# Patient Record
Sex: Male | Born: 2000 | Race: White | Hispanic: No | Marital: Single | Smoking: Never smoker
Health system: Southern US, Community
[De-identification: ages and names within clinical notes are randomized; demographics above are authoritative.]

## PROBLEM LIST (undated history)

## (undated) HISTORY — PX: NO PAST SURGERIES: SHX2092

---
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
## 2018-01-17 ENCOUNTER — Ambulatory Visit: Payer: 59 | Admitting: Internal Medicine

## 2018-01-17 ENCOUNTER — Encounter: Payer: Self-pay | Admitting: Internal Medicine

## 2018-01-17 VITALS — BP 116/68 | HR 65 | Temp 97.9°F | Wt 147.0 lb

## 2018-01-17 DIAGNOSIS — R1084 Generalized abdominal pain: Secondary | ICD-10-CM | POA: Diagnosis not present

## 2018-01-17 DIAGNOSIS — R51 Headache: Secondary | ICD-10-CM | POA: Diagnosis not present

## 2018-01-17 DIAGNOSIS — J029 Acute pharyngitis, unspecified: Secondary | ICD-10-CM

## 2018-01-17 DIAGNOSIS — J020 Streptococcal pharyngitis: Secondary | ICD-10-CM | POA: Diagnosis not present

## 2018-01-17 DIAGNOSIS — R519 Headache, unspecified: Secondary | ICD-10-CM

## 2018-01-17 LAB — POCT RAPID STREP A (OFFICE): RAPID STREP A SCREEN: POSITIVE — AB

## 2018-01-17 MED ORDER — AMOXICILLIN 500 MG PO CAPS: 500.0000 mg | ORAL_CAPSULE | Freq: Three times a day (TID) | ORAL | 0 refills | Status: DC

## 2018-01-17 NOTE — Patient Instructions (Signed)

## 2018-01-17 NOTE — Progress Notes (Signed)
Subjective:    Patient ID: Raymond Porter, male    DOB: 06-21-2001, 17 y.o.   MRN: [id number]  HPI  Pt presents to the clinic today with c/o headache, sore throat and abdominal pain. He reports this started 1-2 days ago. The headache is located in the temples. He describes the pain as achy. He denies visual changes or dizziness. He is having some difficulty swallowing. He denies runny nose, nasal congestion, ear pain or cough. The abdominal pain he describes as squeezing. It is intermittent, worse in the epigastric region. He denies associated nausea, vomiting, diarrhea or constipation. The abdominal pain is not worse or better after eating. He denies fever, chills or body aches. He denies urinary complaints. He has tried Dayquil, Nyquil and Tylenol without any relief. He has had sick contacts.  Review of Systems      No past medical history on file.  No current outpatient medications on file.   No current facility-administered medications for this visit.     No Known Allergies  Family History  Problem Relation Age of Onset  . Lung cancer Maternal Grandmother   . Stroke Paternal Grandfather     Social History   Socioeconomic History  . Marital status: Single    Spouse name: Not on file  . Number of children: Not on file  . Years of education: Not on file  . Highest education level: Not on file  Occupational History  . Not on file  Social Needs  . Financial resource strain: Not on file  . Food insecurity:    Worry: Not on file    Inability: Not on file  . Transportation needs:    Medical: Not on file    Non-medical: Not on file  Tobacco Use  . Smoking status: Never Smoker  . Smokeless tobacco: Never Used  Substance and Sexual Activity  . Alcohol use: No  . Drug use: No  . Sexual activity: Never  Lifestyle  . Physical activity:    Days per week: Not on file    Minutes per session: Not on file  . Stress: Not on file  Relationships  . Social connections:   Talks on phone: Not on file    Gets together: Not on file    Attends religious service: Not on file    Active member of club or organization: Not on file    Attends meetings of clubs or organizations: Not on file    Relationship status: Not on file  . Intimate partner violence:    Fear of current or ex partner: Not on file    Emotionally abused: Not on file    Physically abused: Not on file    Forced sexual activity: Not on file  Other Topics Concern  . Not on file  Social History Narrative  . Not on file     Constitutional: Pt reports headache. Denies fever, malaise, fatigue, or abrupt weight changes.  HEENT: Pt reports sore throat. Denies eye pain, eye redness, ear pain, ringing in the ears, wax buildup, runny nose, nasal congestion, bloody nose. Respiratory: Denies difficulty breathing, shortness of breath, cough or sputum production.   Cardiovascular: Denies chest pain, chest tightness, palpitations or swelling in the hands or feet.  Gastrointestinal: Pt reports abdominal pain. Denies bloating, constipation, diarrhea or blood in the stool.  GU: Denies urgency, frequency, pain with urination, burning sensation, blood in urine, odor or discharge.   No other specific complaints in a complete review of systems (  except as listed in HPI above).  Objective:   Physical Exam  BP 116/68   Pulse 65   Temp 97.9 F (36.6 C) (Oral)   Wt 147 lb (66.7 kg)   SpO2 98%   BMI 21.55 kg/m  Wt Readings from Last 3 Encounters:  01/17/18 147 lb (66.7 kg) (43 %, Z= -0.18)*  05/12/17 147 lb 8 oz (66.9 kg) (49 %, Z= -0.03)*   * Growth percentiles are based on CDC (Boys, 2-20 Years) data.    General: Appears his stated age, well developed, well nourished in NAD. HEENT: Head: normal shape and size, no sinus tenderness noted; Eyes: sclera white, no icterus, conjunctiva pink; Ears: Tm's gray and intact, normal light reflex;  Throat/Mouth: Teeth present, mucosa erythematous and moist, no exudate,  lesions or ulcerations noted.  Neck:  Bilateral anterior cervical adenopathy noted. Cardiovascular: Normal rate and rhythm. S1,S2 noted.  No murmur, rubs or gallops noted.  Pulmonary/Chest: Normal effort and positive vesicular breath sounds. No respiratory distess. No wheezes, rales or ronchi noted.  Abdomen: Soft . Mildly tender in the epigastric, RLQ and LLQ. Negative rebound tenderness. Negative psoas.  Hypoactivebowel sounds. No distention or masses noted. Liver, spleen and kidneys non palpable.      Assessment & Plan:   Headache, Sore Throat, Abdominal Pain:  RST: positive eRx for Amoxil 500 mg TID x 10 days Ibuprofen and salt water gargles as needed for pain and inflammation  Return precautions discussed Nicki Reaper, NP

## 2018-12-29 ENCOUNTER — Encounter: Payer: Self-pay | Admitting: Internal Medicine

## 2018-12-29 ENCOUNTER — Other Ambulatory Visit: Payer: Self-pay

## 2018-12-29 ENCOUNTER — Ambulatory Visit (INDEPENDENT_AMBULATORY_CARE_PROVIDER_SITE_OTHER): Payer: 59 | Admitting: Internal Medicine

## 2018-12-29 VITALS — BP 116/70 | HR 63 | Temp 97.9°F | Wt 150.0 lb

## 2018-12-29 DIAGNOSIS — J301 Allergic rhinitis due to pollen: Secondary | ICD-10-CM

## 2018-12-29 NOTE — Patient Instructions (Signed)

## 2018-12-29 NOTE — Progress Notes (Signed)
HPI  Pt presents to the clinic today with c/o headache, runny nose and sore throat. This started 2-3 days ago. The headache is located on the sides of his head. The pain is intermittent. He describes the pain as pressure. He denies visual changes or dizziness. He is blowing yellow mucous out of his nose. He denies difficulty swallowing. He denies fever or chills but has had body aches, but that has improved. He has tried Aleve, Dayquil and Nyquil with some relief. He has a history of allergies but reports this feels different. He has not had sick contacts. He does not smoke. He did not get his flu shot.   Review of Systems     No past medical history on file.  Family History  Problem Relation Age of Onset  . Lung cancer Maternal Grandmother   . Stroke Paternal Grandfather     Social History   Socioeconomic History  . Marital status: Single    Spouse name: Not on file  . Number of children: Not on file  . Years of education: Not on file  . Highest education level: Not on file  Occupational History  . Not on file  Social Needs  . Financial resource strain: Not on file  . Food insecurity:    Worry: Not on file    Inability: Not on file  . Transportation needs:    Medical: Not on file    Non-medical: Not on file  Tobacco Use  . Smoking status: Never Smoker  . Smokeless tobacco: Never Used  Substance and Sexual Activity  . Alcohol use: No  . Drug use: No  . Sexual activity: Never  Lifestyle  . Physical activity:    Days per week: Not on file    Minutes per session: Not on file  . Stress: Not on file  Relationships  . Social connections:    Talks on phone: Not on file    Gets together: Not on file    Attends religious service: Not on file    Active member of club or organization: Not on file    Attends meetings of clubs or organizations: Not on file    Relationship status: Not on file  . Intimate partner violence:    Fear of current or ex partner: Not on file   Emotionally abused: Not on file    Physically abused: Not on file    Forced sexual activity: Not on file  Other Topics Concern  . Not on file  Social History Narrative  . Not on file    No Known Allergies   Constitutional: Positive headache, body aches. Denies fever, fatigue or abrupt weight changes.  HEENT:  Positive runny nose, sore throat. Denies eye redness, eye pain, pressure behind the eyes, facial pain, nasal congestion, ear pain, ringing in the ears, wax buildup, or bloody nose. Respiratory:  Denies cough, difficulty breathing or shortness of breath.  Cardiovascular: Denies chest pain, chest tightness, palpitations or swelling in the hands or feet.   No other specific complaints in a complete review of systems (except as listed in HPI above).  Objective:   BP 116/70   Pulse 63   Temp 97.9 F (36.6 C) (Oral)   Wt 150 lb (68 kg)   SpO2 98%   BMI 21.99 kg/m   Wt Readings from Last 3 Encounters:  01/17/18 147 lb (66.7 kg) (43 %, Z= -0.18)*  05/12/17 147 lb 8 oz (66.9 kg) (49 %, Z= -0.03)*   *  Growth percentiles are based on CDC (Boys, 2-20 Years) data.     General: Appears his stated age, well developed, in NAD. HEENT: Head: normal shape and size, no sinus tenderness noted; Ears: Tm's gray and intact, normal light reflex; Nose: mucosa boggy and moist, turbinates swollen; Throat/Mouth: + PND. Teeth present, mucosa pink and moist, no exudate noted, no lesions or ulcerations noted.  Neck: No cervical lymphadenopathy.  Cardiovascular: Normal rate and rhythm. S1,S2 noted.  No murmur, rubs or gallops noted.  Pulmonary/Chest: Normal effort and positive vesicular breath sounds. No respiratory distress. No wheezes, rales or ronchi noted.       Assessment & Plan:   Allergic Rhinitis:  Get some rest and drink plenty of water Do salt water gargles for the sore throat Start Zyrtec and Flonase OTC No indication for flu or COVID 19 testing  RTC as needed or if symptoms  persist.   Nicki Reaper, NP

## 2019-08-27 ENCOUNTER — Other Ambulatory Visit: Payer: Self-pay

## 2019-08-27 DIAGNOSIS — Z20822 Contact with and (suspected) exposure to covid-19: Secondary | ICD-10-CM

## 2019-08-28 LAB — NOVEL CORONAVIRUS, NAA: SARS-CoV-2, NAA: NOT DETECTED

## 2019-08-29 ENCOUNTER — Telehealth: Payer: Self-pay | Admitting: Internal Medicine

## 2019-08-29 NOTE — Telephone Encounter (Signed)
Negative COVID results given. Patient results "NOT Detected." Caller expressed understanding. ° °

## 2019-09-24 ENCOUNTER — Other Ambulatory Visit: Payer: Self-pay

## 2019-09-24 DIAGNOSIS — Z20822 Contact with and (suspected) exposure to covid-19: Secondary | ICD-10-CM

## 2019-09-26 ENCOUNTER — Encounter: Payer: Self-pay | Admitting: *Deleted

## 2019-09-26 LAB — NOVEL CORONAVIRUS, NAA: SARS-CoV-2, NAA: DETECTED — AB

## 2019-10-15 ENCOUNTER — Ambulatory Visit (INDEPENDENT_AMBULATORY_CARE_PROVIDER_SITE_OTHER): Payer: 59 | Admitting: Internal Medicine

## 2019-10-15 ENCOUNTER — Other Ambulatory Visit: Payer: Self-pay

## 2019-10-15 ENCOUNTER — Encounter: Payer: Self-pay | Admitting: Internal Medicine

## 2019-10-15 VITALS — Temp 97.0°F

## 2019-10-15 DIAGNOSIS — J029 Acute pharyngitis, unspecified: Secondary | ICD-10-CM

## 2019-10-15 LAB — POCT RAPID STREP A (OFFICE): Rapid Strep A Screen: NEGATIVE

## 2019-10-15 MED ORDER — AMOXICILLIN 500 MG PO CAPS: 500.0000 mg | ORAL_CAPSULE | Freq: Three times a day (TID) | ORAL | 0 refills | Status: DC

## 2019-10-15 NOTE — Patient Instructions (Signed)

## 2019-10-15 NOTE — Progress Notes (Signed)
Virtual Visit via Video Note  I connected with Raymond Porter on 10/15/19 at  9:15 AM EST by a video enabled telemedicine application and verified that I am speaking with the correct person using two identifiers.  Location: Patient: Home Provider: Office   I discussed the limitations of evaluation and management by telemedicine and the availability of in person appointments. The patient expressed understanding and agreed to proceed.  History of Present Illness:  Pt reports sore throat. This started yesterday but seems worse today. He is having difficulty swallowing. He has noticed some white spots on the back of his throat. He denies headache, runny nose, nasal congestion, ear pain, loss of taste or smell, cough or SOB. He denies fever, chills or body aches. No one around him has similar symptoms. He has tried Dayquil/Nyquil with minimal relief. He tested positive for COVID 1 month ago, only symptom he had was loss of taste/smell.   No past medical history on file.  No current outpatient medications on file.   No current facility-administered medications for this visit.    No Known Allergies  Family History  Problem Relation Age of Onset  . Lung cancer Maternal Grandmother   . Stroke Paternal Grandfather     Social History   Socioeconomic History  . Marital status: Single    Spouse name: Not on file  . Number of children: Not on file  . Years of education: Not on file  . Highest education level: Not on file  Occupational History  . Not on file  Tobacco Use  . Smoking status: Never Smoker  . Smokeless tobacco: Never Used  Substance and Sexual Activity  . Alcohol use: No  . Drug use: No  . Sexual activity: Never  Other Topics Concern  . Not on file  Social History Narrative  . Not on file   Social Determinants of Health   Financial Resource Strain:   . Difficulty of Paying Living Expenses: Not on file  Food Insecurity:   . Worried About Charity fundraiser in the  Last Year: Not on file  . Ran Out of Food in the Last Year: Not on file  Transportation Needs:   . Lack of Transportation (Medical): Not on file  . Lack of Transportation (Non-Medical): Not on file  Physical Activity:   . Days of Exercise per Week: Not on file  . Minutes of Exercise per Session: Not on file  Stress:   . Feeling of Stress : Not on file  Social Connections:   . Frequency of Communication with Friends and Family: Not on file  . Frequency of Social Gatherings with Friends and Family: Not on file  . Attends Religious Services: Not on file  . Active Member of Clubs or Organizations: Not on file  . Attends Archivist Meetings: Not on file  . Marital Status: Not on file  Intimate Partner Violence:   . Fear of Current or Ex-Partner: Not on file  . Emotionally Abused: Not on file  . Physically Abused: Not on file  . Sexually Abused: Not on file     Constitutional: Denies fever, malaise, fatigue, headache or abrupt weight changes.  HEENT: Pt reports sore throat. Denies eye pain, eye redness, ear pain, ringing in the ears, wax buildup, runny nose, nasal congestion, bloody nose. Respiratory: Denies difficulty breathing, shortness of breath, cough or sputum production.   Cardiovascular: Denies chest pain, chest tightness, palpitations or swelling in the hands or feet.  Skin:  Denies redness, rashes, lesions or ulcercations.    No other specific complaints in a complete review of systems (except as listed in HPI above).  Observations/Objective:   General: Appears his stated age, well developed, well nourished in NAD. Skin: Warm, dry and intact. No rashes noted. HEENT: Head: normal shape and size; Throat/Mouth: Teeth present, mucosa erythematous and moist, white exudate, lesions or ulcerations noted.  Neck: Pulmonary/Chest: Normal effort. No respiratory distress.  Neurological: Alert and oriented.  Psychiatric: Mood and affect normal. Behavior is normal. Judgment  and thought content normal.    Assessment and Plan:  Sore Throat:  Had pt drive up outside lab area for RST: negative RX for Amoxil 500 mg TID x 10 days Encouraged Ibuprofen 400 mg every 8 hours as needed with food for pain and inflammation Salt water gargles for comfort  Return precautions discussed  Follow Up Instructions:    I discussed the assessment and treatment plan with the patient. The patient was provided an opportunity to ask questions and all were answered. The patient agreed with the plan and demonstrated an understanding of the instructions.   The patient was advised to call back or seek an in-person evaluation if the symptoms worsen or if the condition fails to improve as anticipated.    Nicki Reaper, NP

## 2021-07-24 ENCOUNTER — Encounter: Payer: 59 | Admitting: Nurse Practitioner

## 2021-12-22 ENCOUNTER — Encounter: Payer: Self-pay | Admitting: Internal Medicine

## 2021-12-22 ENCOUNTER — Ambulatory Visit (INDEPENDENT_AMBULATORY_CARE_PROVIDER_SITE_OTHER): Payer: 59 | Admitting: Internal Medicine

## 2021-12-22 ENCOUNTER — Other Ambulatory Visit: Payer: Self-pay

## 2021-12-22 VITALS — BP 132/83 | HR 110 | Temp 97.8°F | Ht 69.0 in | Wt 153.0 lb

## 2021-12-22 DIAGNOSIS — S20412A Abrasion of left back wall of thorax, initial encounter: Secondary | ICD-10-CM | POA: Diagnosis not present

## 2021-12-22 DIAGNOSIS — M25552 Pain in left hip: Secondary | ICD-10-CM

## 2021-12-22 DIAGNOSIS — M546 Pain in thoracic spine: Secondary | ICD-10-CM

## 2021-12-22 DIAGNOSIS — M545 Low back pain, unspecified: Secondary | ICD-10-CM
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
## 2021-12-22 NOTE — Patient Instructions (Signed)

## 2021-12-23 ENCOUNTER — Other Ambulatory Visit: Payer: Self-pay

## 2021-12-23 ENCOUNTER — Encounter: Payer: Self-pay | Admitting: Emergency Medicine

## 2021-12-23 ENCOUNTER — Emergency Department
Admission: EM | Admit: 2021-12-23 | Discharge: 2021-12-23 | Disposition: A | Discharge status: Home / Self Care | Payer: 59 | Attending: Emergency Medicine | Admitting: Emergency Medicine

## 2021-12-23 ENCOUNTER — Emergency Department: Payer: 59

## 2021-12-23 DIAGNOSIS — N200 Calculus of kidney: Secondary | ICD-10-CM | POA: Diagnosis not present

## 2021-12-23 DIAGNOSIS — N281 Cyst of kidney, acquired: Secondary | ICD-10-CM | POA: Diagnosis not present

## 2021-12-23 DIAGNOSIS — M545 Low back pain, unspecified: Secondary | ICD-10-CM | POA: Diagnosis present

## 2021-12-23 LAB — URINALYSIS, COMPLETE (UACMP) WITH MICROSCOPIC
Bacteria, UA: NONE SEEN
RBC / HPF: >50 RBC/hpf — ABNORMAL HIGH (ref 0–5)
Specific Gravity, Urine: 1.025 (ref 1.005–1.030)

## 2021-12-23 MED ORDER — KETOROLAC TROMETHAMINE 30 MG/ML IJ SOLN
15.0000 mg | Freq: Once | INTRAMUSCULAR | Status: AC
  Administered 2021-12-23: 15 mg via INTRAMUSCULAR
  Filled 2021-12-23: qty 1

## 2021-12-23 MED ORDER — KETOROLAC TROMETHAMINE 30 MG/ML IJ SOLN: 15.0000 mg | Freq: Once | INTRAMUSCULAR | Status: DC

## 2021-12-23 NOTE — Discharge Instructions (Addendum)
You have a 3 mm kidney stone on the left side which is likely causing your pain.  You can take Motrin 400 mg every 6 hours for the pain.   ? ?Can we also found a cyst on your kidney.  Please follow-up with your primary care provider regarding this as you should have an ultrasound and may need to have an additional CAT scan or MRI to follow the cyst. ?
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
## 2021-12-23 NOTE — ED Notes (Signed)
Patient transported to CT 
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
## 2022-01-19 ENCOUNTER — Encounter (HOSPITAL_COMMUNITY): Payer: Self-pay | Admitting: Radiology

## 2022-01-19 ENCOUNTER — Encounter: Payer: 59 | Admitting: Internal Medicine

## 2022-02-01 ENCOUNTER — Encounter: Payer: Self-pay | Admitting: Internal Medicine

## 2022-02-01 ENCOUNTER — Other Ambulatory Visit (HOSPITAL_COMMUNITY)
Admission: RE | Admit: 2022-02-01 | Discharge: 2022-02-01 | Disposition: A | Discharge status: Home / Self Care | Payer: 59 | Source: Ambulatory Visit | Attending: Internal Medicine | Admitting: Internal Medicine

## 2022-02-01 ENCOUNTER — Ambulatory Visit (INDEPENDENT_AMBULATORY_CARE_PROVIDER_SITE_OTHER): Payer: 59 | Admitting: Internal Medicine

## 2022-02-01 VITALS — BP 126/80 | HR 58 | Temp 96.9°F | Ht 69.0 in | Wt 153.0 lb

## 2022-02-01 DIAGNOSIS — Z113 Encounter for screening for infections with a predominantly sexual mode of transmission: Secondary | ICD-10-CM | POA: Diagnosis present

## 2022-02-01 DIAGNOSIS — Z23 Encounter for immunization: Secondary | ICD-10-CM | POA: Diagnosis not present

## 2022-02-01 DIAGNOSIS — Z114 Encounter for screening for human immunodeficiency virus [HIV]: Secondary | ICD-10-CM

## 2022-02-01 DIAGNOSIS — Z1159 Encounter for screening for other viral diseases: Secondary | ICD-10-CM

## 2022-02-01 DIAGNOSIS — Z0001 Encounter for general adult medical examination with abnormal findings: Secondary | ICD-10-CM

## 2022-02-01 NOTE — Patient Instructions (Signed)
Health Maintenance, Male Adopting a healthy lifestyle and getting preventive care are important in promoting health and wellness. Ask your health care provider about: The right schedule for you to have regular tests and exams. Things you can do on your own to prevent diseases and keep yourself healthy. What should I know about diet, weight, and exercise? Eat a healthy diet  Eat a diet that includes plenty of vegetables, fruits, low-fat dairy products, and lean protein. Do not eat a lot of foods that are high in solid fats, added sugars, or sodium. Maintain a healthy weight Body mass index (BMI) is a measurement that can be used to identify possible weight problems. It estimates body fat based on height and weight. Your health care provider can help determine your BMI and help you achieve or maintain a healthy weight. Get regular exercise Get regular exercise. This is one of the most important things you can do for your health. Most adults should: Exercise for at least 150 minutes each week. The exercise should increase your heart rate and make you sweat (moderate-intensity exercise). Do strengthening exercises at least twice a week. This is in addition to the moderate-intensity exercise. Spend less time sitting. Even light physical activity can be beneficial. Watch cholesterol and blood lipids Have your blood tested for lipids and cholesterol at 23 years of age, then have this test every 5 years. You may need to have your cholesterol levels checked more often if: Your lipid or cholesterol levels are high. You are older than 23 years of age. You are at high risk for heart disease. What should I know about cancer screening? Many types of cancers can be detected early and may often be prevented. Depending on your health history and family history, you may need to have cancer screening at various ages. This may include screening for: Colorectal cancer. Prostate cancer. Skin cancer. Lung  cancer. What should I know about heart disease, diabetes, and high blood pressure? Blood pressure and heart disease High blood pressure causes heart disease and increases the risk of stroke. This is more likely to develop in people who have high blood pressure readings or are overweight. Talk with your health care provider about your target blood pressure readings. Have your blood pressure checked: Every 3-5 years if you are 23-39 years of age. Every year if you are 40 years old or older. If you are between the ages of 65 and 75 and are a current or former smoker, ask your health care provider if you should have a one-time screening for abdominal aortic aneurysm (AAA). Diabetes Have regular diabetes screenings. This checks your fasting blood sugar level. Have the screening done: Once every three years after age 23 if you are at a normal weight and have a low risk for diabetes. More often and at a younger age if you are overweight or have a high risk for diabetes. What should I know about preventing infection? Hepatitis B If you have a higher risk for hepatitis B, you should be screened for this virus. Talk with your health care provider to find out if you are at risk for hepatitis B infection. Hepatitis C Blood testing is recommended for: Everyone born from 1945 through 1965. Anyone with known risk factors for hepatitis C. Sexually transmitted infections (STIs) You should be screened each year for STIs, including gonorrhea and chlamydia, if: You are sexually active and are younger than 24 years of age. You are older than 24 years of age and your   health care provider tells you that you are at risk for this type of infection. Your sexual activity has changed since you were last screened, and you are at increased risk for chlamydia or gonorrhea. Ask your health care provider if you are at risk. Ask your health care provider about whether you are at high risk for HIV. Your health care provider  may recommend a prescription medicine to help prevent HIV infection. If you choose to take medicine to prevent HIV, you should first get tested for HIV. You should then be tested every 3 months for as long as you are taking the medicine. Follow these instructions at home: Alcohol use Do not drink alcohol if your health care provider tells you not to drink. If you drink alcohol: Limit how much you have to 0-2 drinks a day. Know how much alcohol is in your drink. In the [place] one drink equals one 12 oz bottle of beer (355 mL), one 5 oz glass of wine (148 mL), or one 1 oz glass of hard liquor (44 mL). Lifestyle Do not use any products that contain nicotine or tobacco. These products include cigarettes, chewing tobacco, and vaping devices, such as e-cigarettes. If you need help quitting, ask your health care provider. Do not use street drugs. Do not share needles. Ask your health care provider for help if you need support or information about quitting drugs. General instructions Schedule regular health, dental, and eye exams. Stay current with your vaccines. Tell your health care provider if: You often feel depressed. You have ever been abused or do not feel safe at home. Summary Adopting a healthy lifestyle and getting preventive care are important in promoting health and wellness. Follow your health care provider's instructions about healthy diet, exercising, and getting tested or screened for diseases. Follow your health care provider's instructions on monitoring your cholesterol and blood pressure. This information is not intended to replace advice given to you by your health care provider. Make sure you discuss any questions you have with your health care provider. Document Revised: 02/23/2021 Document Reviewed: 02/23/2021 Elsevier Patient Education  2023 [employer]  

## 2022-02-01 NOTE — Progress Notes (Signed)
? ?Subjective:  ? ? Patient ID: Raymond Porter, male    DOB: [birth year], 21 y.o.   MRN: [id number] ? ?HPI ? ?Patient presents to clinic today for his annual exam. ? ?Flu: never ?Tetanus: 04/2011 ?COVID: Moderna x 2 ?Dentist: as needed ? ?Diet: He does eat meat. He consumes fruits and veggies. He does eat some fried foods. He drinks mostly water. ?Exercise: Body weight exercises, walk, disc golf, roller blading ? ?Review of Systems ? ?No past medical history on file. ? ?No current outpatient medications on file.  ? ?No current facility-administered medications for this visit.  ? ? ?No Known Allergies ? ?Family History  ?Problem Relation Age of Onset  ? Lung cancer Maternal Grandmother   ? Stroke Paternal Grandfather   ? ? ?Social History  ? ?Socioeconomic History  ? Marital status: Single  ?  Spouse name: Not on file  ? Number of children: Not on file  ? Years of education: Not on file  ? Highest education level: Not on file  ?Occupational History  ? Not on file  ?Tobacco Use  ? Smoking status: Never  ? Smokeless tobacco: Never  ?Vaping Use  ? Vaping Use: Never used  ?Substance and Sexual Activity  ? Alcohol use: No  ? Drug use: No  ? Sexual activity: Never  ?Other Topics Concern  ? Not on file  ?Social History Narrative  ? Not on file  ? ?Social Determinants of Health  ? ?Financial Resource Strain: Not on file  ?Food Insecurity: Not on file  ?Transportation Needs: Not on file  ?Physical Activity: Not on file  ?Stress: Not on file  ?Social Connections: Not on file  ?Intimate Partner Violence: Not on file  ? ? ? ?Constitutional: Denies fever, malaise, fatigue, headache or abrupt weight changes.  ?HEENT: Denies eye pain, eye redness, ear pain, ringing in the ears, wax buildup, runny nose, nasal congestion, bloody nose, or sore throat. ?Respiratory: Denies difficulty breathing, shortness of breath, cough or sputum production.   ?Cardiovascular: Denies chest pain, chest tightness, palpitations or swelling in the hands or  feet.  ?Gastrointestinal: Denies abdominal pain, bloating, constipation, diarrhea or blood in the stool.  ?GU: Denies urgency, frequency, pain with urination, burning sensation, blood in urine, odor or discharge. ?Musculoskeletal: Pt reports intermittent back pain. Denies decrease in range of motion, difficulty with gait, muscle pain or joint pain and swelling.  ?Skin: Denies redness, rashes, lesions or ulcercations.  ?Neurological: Denies dizziness, difficulty with memory, difficulty with speech or problems with balance and coordination.  ?Psych: Denies anxiety, depression, SI/HI. ? ?No other specific complaints in a complete review of systems (except as listed in HPI above). ? ?   ?Objective:  ? Physical Exam ?BP 126/80 (BP Location: Left Arm, Patient Position: Sitting, Cuff Size: Large)   Pulse (!) 58   Temp (!) 96.9 ?F (36.1 ?C) (Temporal)   Ht _0  (1.753 m)   Wt 153 lb (69.4 kg)   SpO2 99%   BMI 22.59 kg/m?  ?  ?Wt Readings from Last 3 Encounters:  ?12/23/21 155 lb (70.3 kg)  ?12/22/21 153 lb (69.4 kg)  ?12/29/18 150 lb (68 kg) (42 %, Z= -0.20)*  ? ?* Growth percentiles are based on CDC (Boys, 2-20 Years) data.  ? ? ?General: Appears his stated age, well developed, well nourished in NAD. ?Skin: Warm, dry and intact.  ?HEENT: Head: normal shape and size; Eyes: sclera white, no icterus, conjunctiva pink, PERRLA and EOMs intact;  ?Neck:  Neck supple, trachea midline. No masses, lumps or thyromegaly present.  ?Cardiovascular: Bradycardic with normal rhythm. S1,S2 noted.  No murmur, rubs or gallops noted. No JVD or BLE edema.  ?Pulmonary/Chest: Normal effort and positive vesicular breath sounds. No respiratory distress. No wheezes, rales or ronchi noted.  ?Abdomen: Soft and nontender. Normal bowel sounds. No distention or masses noted. Liver, spleen and kidneys non palpable. ?Musculoskeletal: No bony tenderness noted over the spine. Strength 5/5 BUE/BLE. No difficulty with gait.  ?Neurological: Alert and  oriented. Cranial nerves II-XII grossly intact. Coordination normal.  ?Psychiatric: Mood and affect normal. Behavior is normal. Judgment and thought content normal.  ? ? ?   ?Assessment & Plan:  ? ? ?Preventative Health Maintenance: ? ?Encouraged him to get a flu shot in the fall ?Tetanus today ?Encouraged him to get his COVID booster ?Encouraged him to consume a balanced diet and exercise regimen ?Advised him to see a and dentist annually ?We will check CBC, c-Met, lipid, HIV and hep C today ? ?Screen for STD: ? ?Will check HIV and Hep C today ?Will check urine gonorrhea, chlamydia and trich ? ?RTC in 1 year, sooner if needed ?Webb Silversmith, NP ? ?

## 2022-02-02 LAB — COMPLETE METABOLIC PANEL WITH GFR
AG Ratio: 2.3 (calc) (ref 1.0–2.5)
ALT: 19 U/L (ref 9–46)
AST: 22 U/L (ref 10–40)
Albumin: 4.5 g/dL (ref 3.6–5.1)
Alkaline phosphatase (APISO): 59 U/L (ref 36–130)
BUN: 18 mg/dL (ref 7–25)
CO2: 28 mmol/L (ref 20–32)
Calcium: 9.3 mg/dL (ref 8.6–10.3)
Chloride: 105 mmol/L (ref 98–110)
Creat: 1.11 mg/dL (ref 0.60–1.24)
Globulin: 2 g/dL (calc) (ref 1.9–3.7)
Glucose, Bld: 86 mg/dL (ref 65–99)
Potassium: 4.2 mmol/L (ref 3.5–5.3)
Sodium: 141 mmol/L (ref 135–146)
Total Bilirubin: 0.8 mg/dL (ref 0.2–1.2)
Total Protein: 6.5 g/dL (ref 6.1–8.1)
eGFR: 96 mL/min/{1.73_m2} (ref >=60)

## 2022-02-02 LAB — CBC
HCT: 47.6 % (ref 38.5–50.0)
Hemoglobin: 15.8 g/dL (ref 13.2–17.1)
MCH: 29.5 pg (ref 27.0–33.0)
MCHC: 33.2 g/dL (ref 32.0–36.0)
MCV: 88.8 fL (ref 80.0–100.0)
MPV: 12.1 fL (ref 7.5–12.5)
Platelets: 131 10*3/uL — ABNORMAL LOW (ref 140–400)
RBC: 5.36 10*6/uL (ref 4.20–5.80)
RDW: 12.3 % (ref 11.0–15.0)
WBC: 3.7 10*3/uL — ABNORMAL LOW (ref 3.8–10.8)

## 2022-02-02 LAB — URINE CYTOLOGY ANCILLARY ONLY
Chlamydia: NEGATIVE
Comment: NEGATIVE
Comment: NEGATIVE
Comment: NORMAL
Neisseria Gonorrhea: NEGATIVE
Trichomonas: NEGATIVE

## 2022-02-02 LAB — HEPATITIS C ANTIBODY
Hepatitis C Ab: NONREACTIVE
SIGNAL TO CUT-OFF: 0.05 (ref <1.00)

## 2022-02-02 LAB — LIPID PANEL
Cholesterol: 153 mg/dL (ref <200)
HDL: 59 mg/dL (ref >=40)
LDL Cholesterol (Calc): 83 mg/dL (calc)
Non-HDL Cholesterol (Calc): 94 mg/dL (calc) (ref <130)
Total CHOL/HDL Ratio: 2.6 (calc) (ref <5.0)
Triglycerides: 40 mg/dL (ref <150)

## 2022-02-02 LAB — HIV ANTIBODY (ROUTINE TESTING W REFLEX): HIV 1&2 Ab, 4th Generation: NONREACTIVE

## 2022-02-04 ENCOUNTER — Ambulatory Visit: Payer: Self-pay | Admitting: *Deleted

## 2022-02-04 NOTE — Telephone Encounter (Signed)
Patient called back to report in March was seen in ED for kidney stone and it was noted he has a renal cyst. Patient wanted to notify PCP if this could cause elevated white blood cell count. No additional questions from patient at this time.  ?

## 2022-02-04 NOTE — Telephone Encounter (Signed)
No it should not. Smoking can cause elevated WBC. Some people just have elevated WBC for no reason. ?

## 2022-02-04 NOTE — Telephone Encounter (Signed)
LMTCB 02/04/2022.  PEC please advise pt when he calls back.  ? ?Thanks,  ? ?-Vernona Rieger  ?

## 2022-03-12 ENCOUNTER — Other Ambulatory Visit: Payer: 59

## 2022-03-12 ENCOUNTER — Other Ambulatory Visit: Payer: Self-pay | Admitting: Internal Medicine

## 2022-03-12 DIAGNOSIS — D696 Thrombocytopenia, unspecified: Secondary | ICD-10-CM

## 2022-03-12 DIAGNOSIS — D709 Neutropenia, unspecified: Secondary | ICD-10-CM | POA: Insufficient documentation

## 2022-03-12 LAB — CBC WITH DIFFERENTIAL/PLATELET
Absolute Monocytes: 605 cells/uL (ref 200–950)
Basophils Absolute: 50 cells/uL (ref 0–200)
Basophils Relative: 0.6 %
Eosinophils Absolute: 42 cells/uL (ref 15–500)
Eosinophils Relative: 0.5 %
HCT: 47.2 % (ref 38.5–50.0)
Hemoglobin: 15.7 g/dL (ref 13.2–17.1)
Lymphs Abs: 2276 cells/uL (ref 850–3900)
MCH: 29.1 pg (ref 27.0–33.0)
MCHC: 33.3 g/dL (ref 32.0–36.0)
MCV: 87.4 fL (ref 80.0–100.0)
MPV: 11.7 fL (ref 7.5–12.5)
Monocytes Relative: 7.2 %
Neutro Abs: 5426 cells/uL (ref 1500–7800)
Neutrophils Relative %: 64.6 %
Platelets: 178 10*3/uL (ref 140–400)
RBC: 5.4 10*6/uL (ref 4.20–5.80)
RDW: 11.8 % (ref 11.0–15.0)
Total Lymphocyte: 27.1 %
WBC: 8.4 10*3/uL (ref 3.8–10.8)

## 2022-05-21 ENCOUNTER — Ambulatory Visit
Admission: RE | Admit: 2022-05-21 | Discharge: 2022-05-21 | Disposition: A | Discharge status: Home / Self Care | Payer: 59 | Source: Ambulatory Visit | Attending: Internal Medicine | Admitting: Internal Medicine

## 2022-05-21 ENCOUNTER — Ambulatory Visit (INDEPENDENT_AMBULATORY_CARE_PROVIDER_SITE_OTHER): Payer: 59 | Admitting: Internal Medicine

## 2022-05-21 ENCOUNTER — Encounter: Payer: Self-pay | Admitting: Internal Medicine

## 2022-05-21 VITALS — BP 124/72 | HR 98 | Temp 97.7°F | Wt 156.0 lb

## 2022-05-21 DIAGNOSIS — M25561 Pain in right knee: Secondary | ICD-10-CM | POA: Diagnosis present

## 2022-05-21 MED ORDER — PREDNISONE 10 MG PO TABS: ORAL_TABLET | ORAL | 0 refills | Status: AC

## 2022-05-21 NOTE — Progress Notes (Signed)
Subjective:    Patient ID: Raymond Porter, male    DOB: [birth year], 21 y.o.   MRN: [id number]  HPI  Patient presents to clinic today with complaint of right knee pain.  This started in the beginning of June after a run. He describes the pain as sore and achy. The pain is worse with driving, walking. He denies numbness, tingling or weakness of the right lower extremity. He did not notice any swelling. He is currently unable to run. He has tried rest, ice, elevation, compression and NSAID's OTC with minimal improvement in symptoms.   Review of Systems     No past medical history on file.  No current outpatient medications on file.   No current facility-administered medications for this visit.    No Known Allergies  Family History  Problem Relation Age of Onset   Lung cancer Maternal Grandmother    Stroke Paternal Grandfather    Colon cancer Neg Hx    Prostate cancer Neg Hx     Social History   Socioeconomic History   Marital status: Single    Spouse name: Not on file   Number of children: Not on file   Years of education: Not on file   Highest education level: Not on file  Occupational History   Not on file  Tobacco Use   Smoking status: Never   Smokeless tobacco: Never  Vaping Use   Vaping Use: Never used  Substance and Sexual Activity   Alcohol use: No   Drug use: No   Sexual activity: Never  Other Topics Concern   Not on file  Social History Narrative   Not on file   Social Determinants of Health   Financial Resource Strain: Not on file  Food Insecurity: Not on file  Transportation Needs: Not on file  Physical Activity: Not on file  Stress: Not on file  Social Connections: Not on file  Intimate Partner Violence: Not on file     Constitutional: Denies fever, malaise, fatigue, headache or abrupt weight changes.  Respiratory: Denies difficulty breathing, shortness of breath, cough or sputum production.   Cardiovascular: Denies chest pain, chest  tightness, palpitations or swelling in the hands or feet.  Musculoskeletal: Patient reports right knee pain.  Denies decrease in range of motion, difficulty with gait, muscle pain or joint swelling.  Neurological: Denies numbness, tingling, weakness, or problems with balance and coordination.  Psych: Denies anxiety, depression, SI/HI.  No other specific complaints in a complete review of systems (except as listed in HPI above).  Objective:   Physical Exam  BP 124/72 (BP Location: Left Arm, Patient Position: Sitting, Cuff Size: Normal)   Pulse 98   Temp 97.7 F (36.5 C) (Temporal)   Wt 156 lb (70.8 kg)   SpO2 99%   BMI 23.04 kg/m   Wt Readings from Last 3 Encounters:  02/01/22 153 lb (69.4 kg)  12/23/21 155 lb (70.3 kg)  12/22/21 153 lb (69.4 kg)    General: Appears his stated age, well developed, well nourished in NAD. Skin: Warm, dry and intact.  Cardiovascular: Normal rate and rhythm.  Pulmonary/Chest: Normal effort and positive vesicular breath sounds. No respiratory distress. No wheezes, rales or ronchi noted.  Musculoskeletal: Normal flexion and extension of the right knee.  Pain with palpation over the inferior patellar tendon.  Pain with palpation over bilateral joint lines.  No signs of joint swelling.  Strength 5/5 BLE.  No difficulty with gait.  Neurological: Alert and oriented.  BMET    Component Value Date/Time   NA 141 02/01/2022 0917   K 4.2 02/01/2022 0917   CL 105 02/01/2022 0917   CO2 28 02/01/2022 0917   GLUCOSE 86 02/01/2022 0917   BUN 18 02/01/2022 0917   CREATININE 1.11 02/01/2022 0917   CALCIUM 9.3 02/01/2022 0917    Lipid Panel     Component Value Date/Time   CHOL 153 02/01/2022 0917   TRIG 40 02/01/2022 0917   HDL 59 02/01/2022 0917   CHOLHDL 2.6 02/01/2022 0917   LDLCALC 83 02/01/2022 0917    CBC    Component Value Date/Time   WBC 8.4 03/12/2022 1325   RBC 5.40 03/12/2022 1325   HGB 15.7 03/12/2022 1325   HCT 47.2 03/12/2022 1325    PLT 178 03/12/2022 1325   MCV 87.4 03/12/2022 1325   MCH 29.1 03/12/2022 1325   MCHC 33.3 03/12/2022 1325   RDW 11.8 03/12/2022 1325   LYMPHSABS 2,276 03/12/2022 1325   EOSABS 42 03/12/2022 1325   BASOSABS 50 03/12/2022 1325    Hgb A1C No results found for: "HGBA1C"         Assessment & Plan:   Right Knee Pain:  Rx for Pred taper x9 days X-ray right knee ordered Consider PT versus MRI if symptoms persist or worsen  RTC in 8 months for your annual exam Nicki Reaper, NP

## 2022-05-21 NOTE — Patient Instructions (Signed)

## 2022-06-02 ENCOUNTER — Encounter: Payer: Self-pay | Admitting: Internal Medicine

## 2023-06-26 IMAGING — CT CT RENAL STONE PROTOCOL
2 of 4 series · 15 of 46 positions shown, 17 images · non-contrast
Comparison: None.

CLINICAL DATA: Flank pain with kidney stone suspected



[Series 2: ap without · axial · non-contrast · 0.66mm/px · z∈[-1133,-658]mm · 12 of 106 slices shown, 14 images]
[im 6/106  soft-tissue]
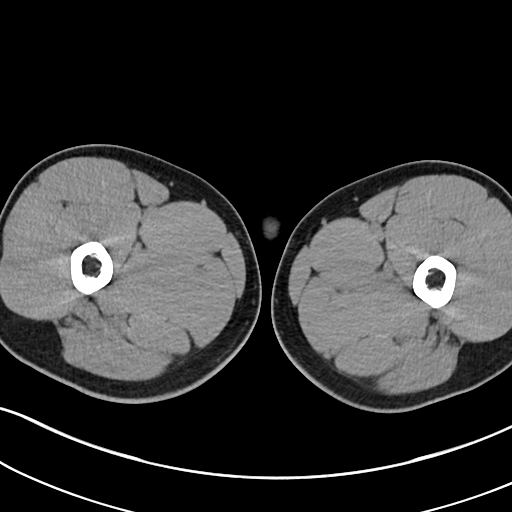
[im 6/106  bone]
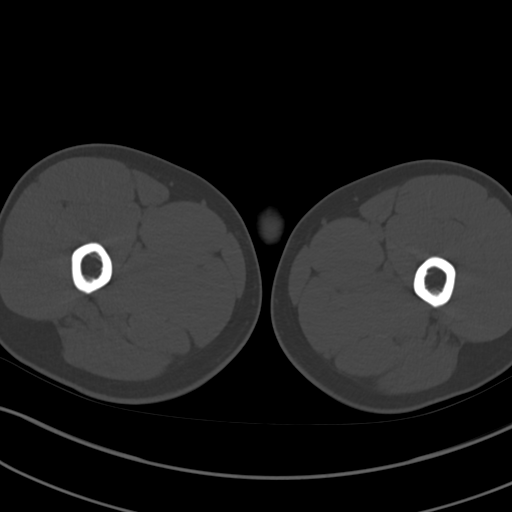
[im 16/106  soft-tissue]
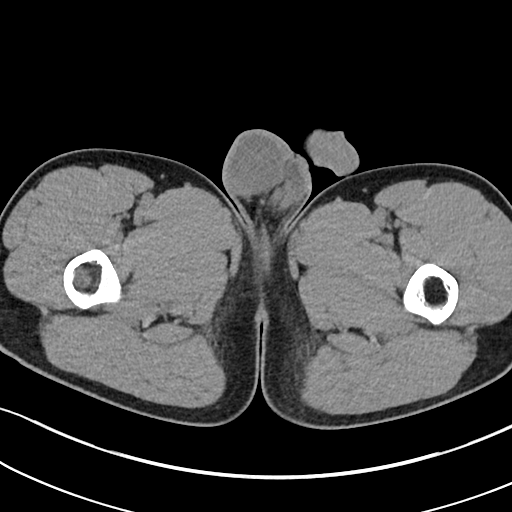
[im 26/106  soft-tissue]
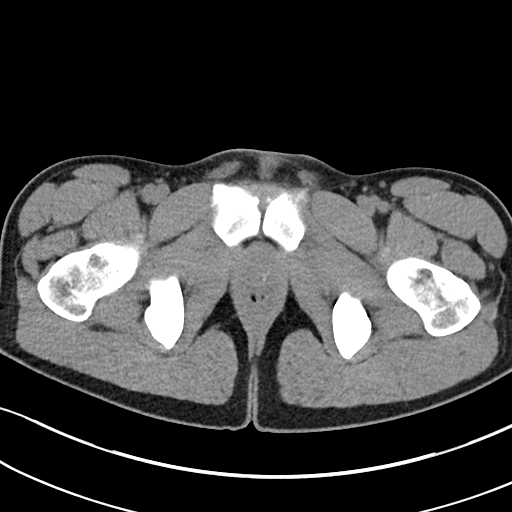
[im 31/106  soft-tissue]
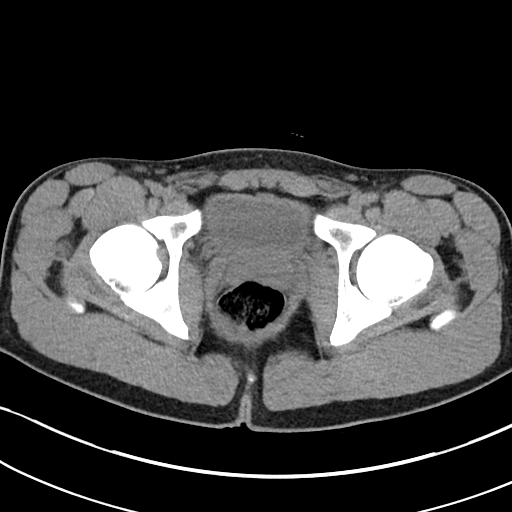
[im 41/106  soft-tissue]
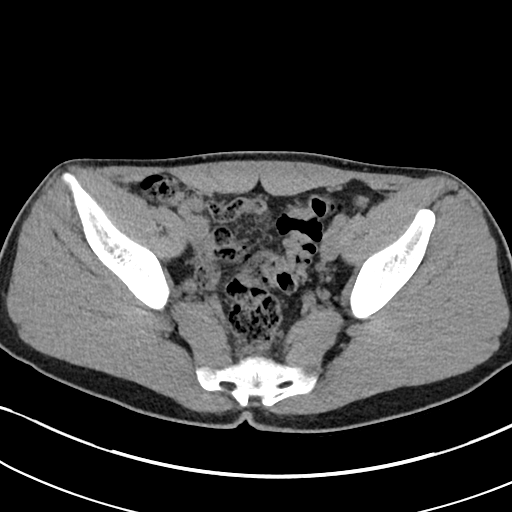
[im 51/106  soft-tissue]
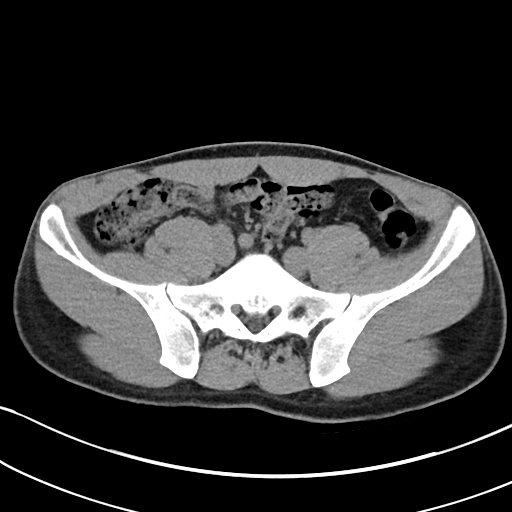
[im 56/106  soft-tissue]
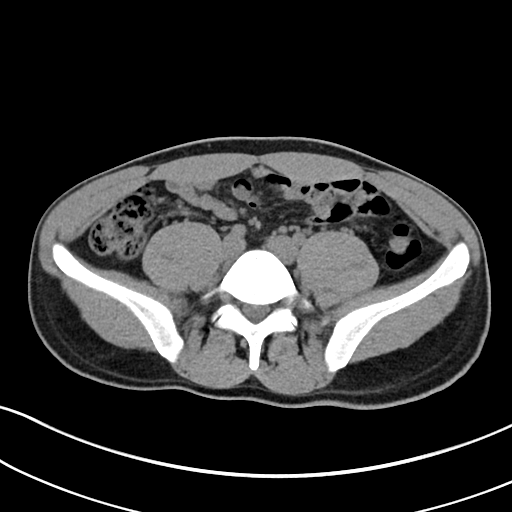
[im 66/106  soft-tissue]
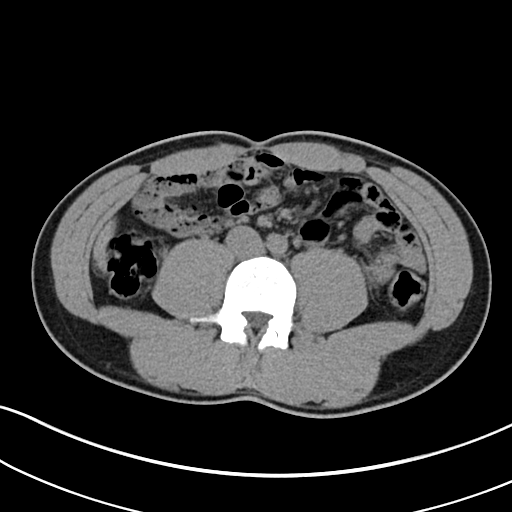
[im 76/106  soft-tissue]
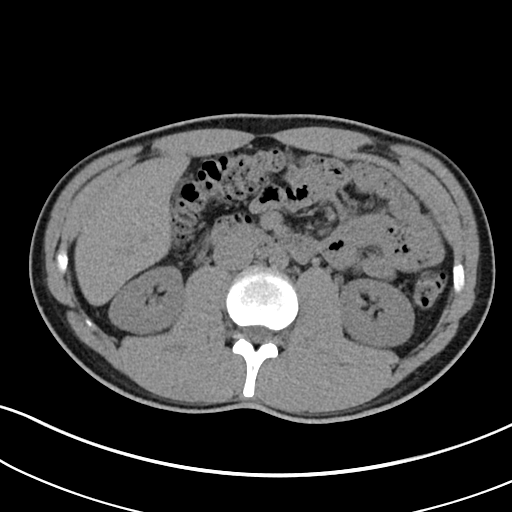
[im 76/106  bone]
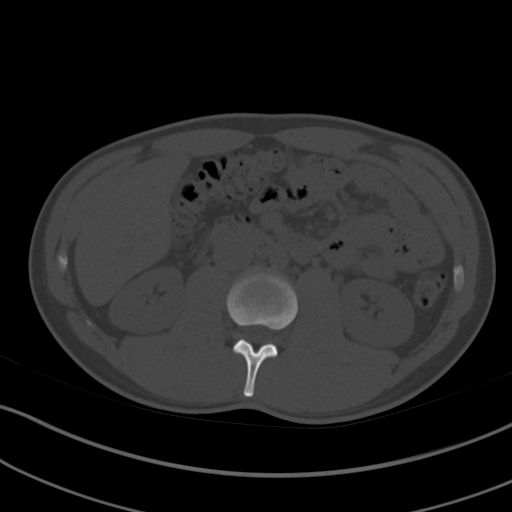
[im 81/106  soft-tissue]
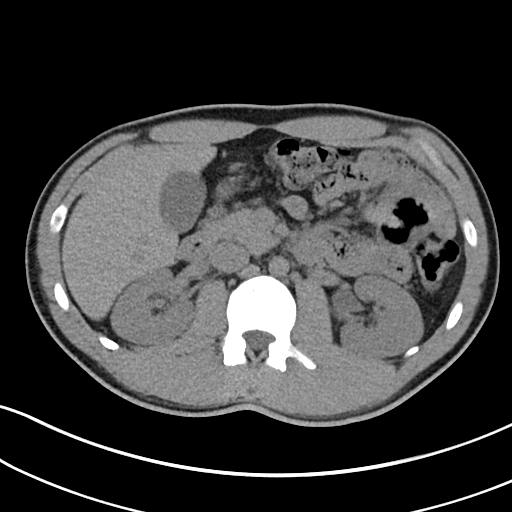
[im 91/106  soft-tissue]
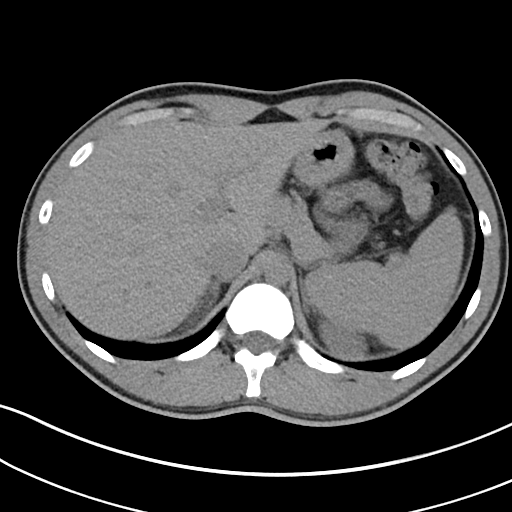
[im 101/106  soft-tissue]
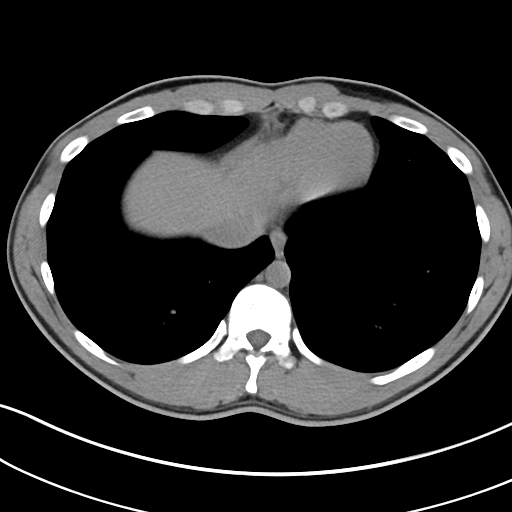

[Series 5: cor · coronal · 0.73mm/px · 3 of 81 slices shown]
[im 27/81  soft-tissue]
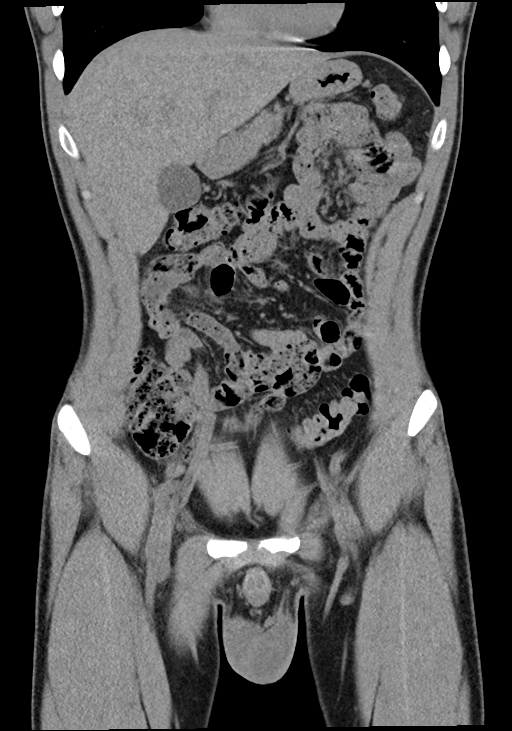
[im 36/81  soft-tissue]
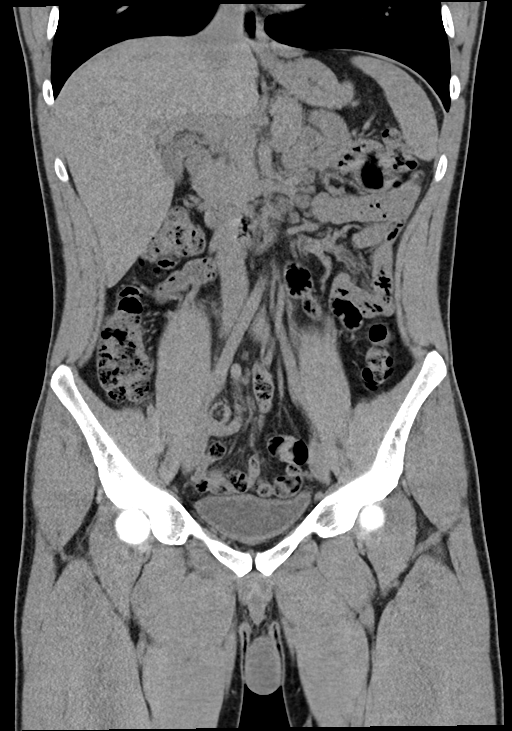
[im 45/81  soft-tissue]
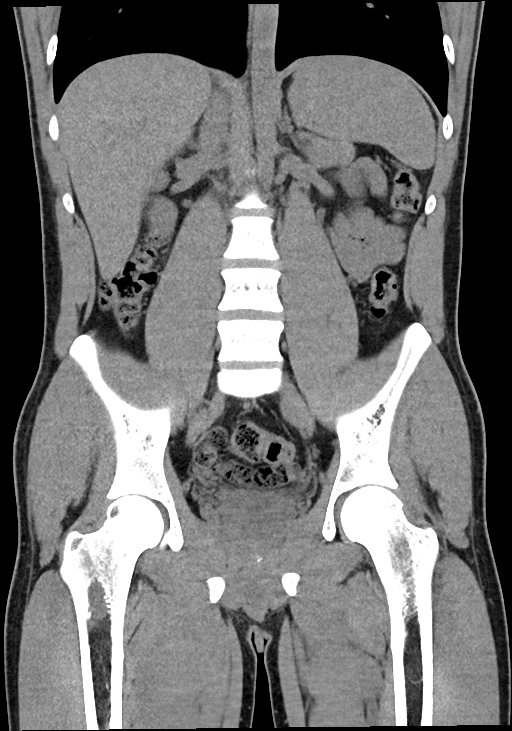

[15 of 46 positions shown; findings below may reference images not displayed]

FINDINGS: Lower chest:  No contributory findings.

Hepatobiliary: No focal liver abnormality.No evidence of biliary
obstruction or stone.

Pancreas: Unremarkable.

Spleen: Unremarkable.

Adrenals/Urinary Tract: Negative adrenals. 3 mm stone at the left
UVJ. No hydronephrosis.

High-density lesion measuring 2.2 cm in the upper pole left kidney
with dependent calcific density that appears layering on sagittal
reformats

Punctate right renal calculus on coronal reformats.

Unremarkable bladder.

Stomach/Bowel:  No obstruction. No appendicitis.

Vascular/Lymphatic: No acute vascular abnormality. No mass or
adenopathy.

Reproductive:No pathologic findings.

Other: No ascites or pneumoperitoneum.

Musculoskeletal: No acute abnormalities. 17 mm lucency in the
greater trochanter of the left femur with benign well-circumscribed
margins.
IMPRESSION: 1. Nonobstructing 3 mm stone at the left UVJ.
2. 2.2 cm lesion at the upper pole left kidney with layering
high-density suggesting hemorrhagic or proteinaceous cyst with
calcification. Recommend further workup; sonography may be
diagnostic and attempted prior to renal protocol CT or MRI with
contrast.
3. Punctate right renal calculus.

## 2023-10-17 ENCOUNTER — Encounter: Payer: Self-pay | Admitting: Internal Medicine
